# Patient Record
Sex: Female | Born: 1980
Health system: Southern US, Community
[De-identification: ages and names within clinical notes are randomized; demographics above are authoritative.]

## PROBLEM LIST (undated history)

## (undated) ENCOUNTER — Ambulatory Visit (HOSPITAL_COMMUNITY): Disposition: A | Discharge status: Home / Self Care | Payer: BC Managed Care – PPO

## (undated) DIAGNOSIS — E669 Obesity, unspecified: Secondary | ICD-10-CM

## (undated) DIAGNOSIS — R7303 Prediabetes: Secondary | ICD-10-CM

## (undated) DIAGNOSIS — R200 Anesthesia of skin: Secondary | ICD-10-CM

## (undated) DIAGNOSIS — Z973 Presence of spectacles and contact lenses: Secondary | ICD-10-CM

## (undated) DIAGNOSIS — G43909 Migraine, unspecified, not intractable, without status migrainosus: Secondary | ICD-10-CM

## (undated) DIAGNOSIS — K219 Gastro-esophageal reflux disease without esophagitis: Secondary | ICD-10-CM

## (undated) DIAGNOSIS — I959 Hypotension, unspecified: Secondary | ICD-10-CM

## (undated) DIAGNOSIS — D649 Anemia, unspecified: Secondary | ICD-10-CM

## (undated) DIAGNOSIS — J45909 Unspecified asthma, uncomplicated: Secondary | ICD-10-CM

## (undated) HISTORY — PX: WISDOM TOOTH EXTRACTION: SHX21

---
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
## 2000-03-07 ENCOUNTER — Emergency Department (HOSPITAL_COMMUNITY): Admission: EM | Admit: 2000-03-07 | Discharge: 2000-03-07 | Payer: Self-pay | Admitting: Emergency Medicine

## 2002-01-29 ENCOUNTER — Emergency Department (HOSPITAL_COMMUNITY): Admission: EM | Admit: 2002-01-29 | Discharge: 2002-01-29 | Payer: Self-pay | Admitting: Emergency Medicine

## 2004-09-06 ENCOUNTER — Emergency Department (HOSPITAL_COMMUNITY): Admission: EM | Admit: 2004-09-06 | Discharge: 2004-09-06 | Payer: Self-pay | Admitting: Emergency Medicine

## 2005-02-02 ENCOUNTER — Emergency Department (HOSPITAL_COMMUNITY): Admission: EM | Admit: 2005-02-02 | Discharge: 2005-02-02 | Payer: Self-pay | Admitting: Emergency Medicine

## 2005-12-22 ENCOUNTER — Other Ambulatory Visit: Admission: RE | Admit: 2005-12-22 | Discharge: 2005-12-22 | Payer: Self-pay | Admitting: Family Medicine

## 2011-01-20 ENCOUNTER — Emergency Department (HOSPITAL_COMMUNITY): Payer: No Typology Code available for payment source

## 2011-01-20 ENCOUNTER — Emergency Department (HOSPITAL_COMMUNITY)
Admission: EM | Admit: 2011-01-20 | Discharge: 2011-01-21 | Disposition: A | Discharge status: Home / Self Care | Payer: No Typology Code available for payment source | Attending: Emergency Medicine | Admitting: Emergency Medicine

## 2011-01-20 DIAGNOSIS — Z79899 Other long term (current) drug therapy: Secondary | ICD-10-CM | POA: Insufficient documentation

## 2011-01-20 DIAGNOSIS — E876 Hypokalemia: Secondary | ICD-10-CM | POA: Insufficient documentation

## 2011-01-20 DIAGNOSIS — J45909 Unspecified asthma, uncomplicated: Secondary | ICD-10-CM | POA: Insufficient documentation

## 2011-01-20 DIAGNOSIS — R42 Dizziness and giddiness: Secondary | ICD-10-CM | POA: Insufficient documentation

## 2011-01-20 DIAGNOSIS — M542 Cervicalgia: Secondary | ICD-10-CM | POA: Insufficient documentation

## 2011-01-20 DIAGNOSIS — M546 Pain in thoracic spine: Secondary | ICD-10-CM | POA: Insufficient documentation

## 2011-01-20 DIAGNOSIS — R109 Unspecified abdominal pain: Secondary | ICD-10-CM | POA: Insufficient documentation

## 2011-01-20 DIAGNOSIS — T07XXXA Unspecified multiple injuries, initial encounter: Secondary | ICD-10-CM | POA: Insufficient documentation

## 2011-01-20 DIAGNOSIS — S335XXA Sprain of ligaments of lumbar spine, initial encounter: Secondary | ICD-10-CM | POA: Insufficient documentation

## 2011-01-20 DIAGNOSIS — K219 Gastro-esophageal reflux disease without esophagitis: Secondary | ICD-10-CM | POA: Insufficient documentation

## 2011-01-20 DIAGNOSIS — S139XXA Sprain of joints and ligaments of unspecified parts of neck, initial encounter: Secondary | ICD-10-CM | POA: Insufficient documentation

## 2011-01-20 DIAGNOSIS — M79609 Pain in unspecified limb: Secondary | ICD-10-CM | POA: Insufficient documentation

## 2011-01-20 DIAGNOSIS — M25559 Pain in unspecified hip: Secondary | ICD-10-CM | POA: Insufficient documentation

## 2011-01-20 DIAGNOSIS — M545 Low back pain, unspecified: Secondary | ICD-10-CM | POA: Insufficient documentation

## 2011-01-20 LAB — POCT I-STAT, CHEM 8
BUN: 9 mg/dL (ref 6–23)
Calcium, Ion: 1.11 mmol/L — ABNORMAL LOW (ref 1.12–1.32)
Chloride: 101 mEq/L (ref 96–112)
Creatinine, Ser: 1 mg/dL (ref 0.4–1.2)
Glucose, Bld: 106 mg/dL — ABNORMAL HIGH (ref 70–99)
HCT: 37 % (ref 36.0–46.0)
Hemoglobin: 12.6 g/dL (ref 12.0–15.0)
Potassium: 3.3 mEq/L — ABNORMAL LOW (ref 3.5–5.1)
Sodium: 138 mEq/L (ref 135–145)
TCO2: 26 mmol/L (ref 0–100)

## 2011-01-20 LAB — URINALYSIS, ROUTINE W REFLEX MICROSCOPIC
Nitrite: NEGATIVE
Protein, ur: NEGATIVE mg/dL
Specific Gravity, Urine: 1.029 (ref 1.005–1.030)
Urobilinogen, UA: 1 mg/dL (ref 0.0–1.0)

## 2011-01-20 LAB — SAMPLE TO BLOOD BANK

## 2011-01-20 LAB — POCT PREGNANCY, URINE: Preg Test, Ur: NEGATIVE

## 2011-01-21 MED ORDER — IOHEXOL 300 MG/ML  SOLN
100.0000 mL | Freq: Once | INTRAMUSCULAR | Status: AC | PRN
  Administered 2011-01-21: 100 mL via INTRAVENOUS

## 2011-03-29 ENCOUNTER — Inpatient Hospital Stay (INDEPENDENT_AMBULATORY_CARE_PROVIDER_SITE_OTHER)
Admission: RE | Admit: 2011-03-29 | Discharge: 2011-03-29 | Disposition: A | Discharge status: Home / Self Care | Payer: BC Managed Care – PPO | Source: Ambulatory Visit | Attending: Emergency Medicine | Admitting: Emergency Medicine

## 2011-03-29 DIAGNOSIS — B009 Herpesviral infection, unspecified: Secondary | ICD-10-CM
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
## 2016-08-25 ENCOUNTER — Emergency Department (HOSPITAL_COMMUNITY)
Admission: EM | Admit: 2016-08-25 | Discharge: 2016-08-25 | Disposition: A | Discharge status: Home / Self Care | Payer: BC Managed Care – PPO | Attending: Emergency Medicine | Admitting: Emergency Medicine

## 2016-08-25 ENCOUNTER — Encounter (HOSPITAL_COMMUNITY): Payer: Self-pay | Admitting: Emergency Medicine

## 2016-08-25 DIAGNOSIS — R1030 Lower abdominal pain, unspecified: Secondary | ICD-10-CM | POA: Diagnosis present

## 2016-08-25 DIAGNOSIS — R109 Unspecified abdominal pain: Secondary | ICD-10-CM

## 2016-08-25 LAB — URINALYSIS, ROUTINE W REFLEX MICROSCOPIC
Bilirubin Urine: NEGATIVE
Glucose, UA: NEGATIVE mg/dL
Ketones, ur: NEGATIVE mg/dL
Leukocytes, UA: NEGATIVE
Nitrite: NEGATIVE
PROTEIN: 100 mg/dL — AB
Specific Gravity, Urine: 1.031 — ABNORMAL HIGH (ref 1.005–1.030)
pH: 5 (ref 5.0–8.0)

## 2016-08-25 LAB — POC URINE PREG, ED: PREG TEST UR: NEGATIVE

## 2016-08-25 LAB — PREGNANCY, URINE: PREG TEST UR: NEGATIVE

## 2016-08-25 MED ORDER — CEPHALEXIN 500 MG PO CAPS
500.0000 mg | ORAL_CAPSULE | Freq: Once | ORAL | Status: AC
  Administered 2016-08-25: 500 mg via ORAL
  Filled 2016-08-25: qty 1

## 2016-08-25 MED ORDER — CEPHALEXIN 500 MG PO CAPS: 500.0000 mg | ORAL_CAPSULE | Freq: Three times a day (TID) | ORAL | 0 refills | Status: DC

## 2016-08-25 MED ORDER — OXYCODONE-ACETAMINOPHEN 5-325 MG PO TABS
1.0000 | ORAL_TABLET | Freq: Once | ORAL | Status: AC
  Administered 2016-08-25: 1 via ORAL
  Filled 2016-08-25: qty 1

## 2016-08-25 MED ORDER — IBUPROFEN 200 MG PO TABS
600.0000 mg | ORAL_TABLET | Freq: Once | ORAL | Status: AC
  Administered 2016-08-25: 600 mg via ORAL
  Filled 2016-08-25: qty 3

## 2016-08-25 MED ORDER — IBUPROFEN 600 MG PO TABS: 600.0000 mg | ORAL_TABLET | Freq: Three times a day (TID) | ORAL | 0 refills | Status: DC | PRN

## 2016-08-25 MED FILL — CEPHALEXIN 500 MG CAPSULE: 500 | 5 days supply | Qty: 15 | Fill #0

## 2016-08-25 MED FILL — IBUPROFEN 600 MG TABLET: 600 | 5 days supply | Qty: 15 | Fill #0

## 2016-08-25 NOTE — ED Provider Notes (Signed)
[facility]-EMERGENCY DEPT Provider Note   CSN: [id number] Arrival date & time: 08/25/16  0756     History   Chief Complaint Chief Complaint  Patient presents with  . menstrual cramps    HPI Virginia Sloan is a 35 y.o. female.  HPI A she presents emergency department with complaints of crampy lower abdominal discomfort which began yesterday and feels that his cramping increased today.  She is started her menstrual cycle.  She states that she had cramping with her menstrual cycle last month but was not as severe.  She has her usual amount of vaginal bleeding associated with this.  Denies back pain flank pain.  No fevers or chills.  No nausea or vomiting.  No dysuria or urinary frequency.  Symptoms are moderate in severity   History reviewed. No pertinent past medical history.  There are no active problems to display for this patient.   History reviewed. No pertinent surgical history.  OB History    No data available       Home Medications    Prior to Admission medications   Medication Sig Start Date End Date Taking? Authorizing Provider  albuterol (PROVENTIL HFA;VENTOLIN HFA) 108 (90 Base) MCG/ACT inhaler Inhale 2 puffs into the lungs every 6 (six) hours as needed for shortness of breath. 06/20/15  Yes Historical Provider, MD  esomeprazole (NEXIUM) 20 MG capsule Take 20 mg by mouth daily.   Yes Historical Provider, MD  Prenatal Vit-Fe Fumarate-FA (PRENATAL MULTIVITAMIN) TABS tablet Take 1 tablet by mouth daily at 12 noon.   Yes Historical Provider, MD  tetrahydrozoline (VISINE) 0.05 % ophthalmic solution Place 1 drop into both eyes daily as needed (dry eyes/contact).   Yes Historical Provider, MD  cephALEXin (KEFLEX) 500 MG capsule Take 1 capsule (500 mg total) by mouth 3 (three) times daily. 08/25/16   Azalia BilisKevin Destinee Taber, MD  ibuprofen (ADVIL,MOTRIN) 600 MG tablet Take 1 tablet (600 mg total) by mouth every 8 (eight) hours as needed. 08/25/16   Azalia BilisKevin Jacky Dross, MD    Family  History No family history on file.  Social History Social History  Substance Use Topics  . Smoking status: Never Smoker  . Smokeless tobacco: Not on file  . Alcohol use No     Allergies   Tomato and Other   Review of Systems Review of Systems  All other systems reviewed and are negative.    Physical Exam Updated Vital Signs BP 113/80 (BP Location: Left Arm)   Pulse 79   Temp 98.1 F (36.7 C) (Oral)   Resp 16   Ht 5' 4.25" (1.632 m)   Wt 208 lb (94.3 kg)   LMP 08/24/2016   SpO2 95%   BMI 35.43 kg/m   Physical Exam  Constitutional: She is oriented to person, place, and time. She appears well-developed and well-nourished. No distress.  HENT:  Head: Normocephalic and atraumatic.  Eyes: EOM are normal.  Neck: Normal range of motion.  Cardiovascular: Normal rate, regular rhythm and normal heart sounds.   Pulmonary/Chest: Effort normal and breath sounds normal.  Abdominal: Soft. She exhibits no distension. There is no tenderness.  Musculoskeletal: Normal range of motion.  Neurological: She is alert and oriented to person, place, and time.  Skin: Skin is warm and dry.  Psychiatric: She has a normal mood and affect. Judgment normal.  Nursing note and vitals reviewed.    ED Treatments / Results  Labs (all labs ordered are listed, but only abnormal results are displayed) Labs Reviewed  URINALYSIS, ROUTINE W REFLEX MICROSCOPIC - Abnormal; Notable for the following:       Result Value   APPearance HAZY (*)    Specific Gravity, Urine 1.031 (*)    Hgb urine dipstick LARGE (*)    Protein, ur 100 (*)    Bacteria, UA RARE (*)    Squamous Epithelial / LPF 0-5 (*)    All other components within normal limits  URINE CULTURE  PREGNANCY, URINE  POC URINE PREG, ED    EKG  EKG Interpretation None       Radiology No results found.  Procedures Procedures (including critical care time)  Medications Ordered in ED Medications  oxyCODONE-acetaminophen  (PERCOCET/ROXICET) 5-325 MG per tablet 1 tablet (1 tablet Oral Given 08/25/16 1104)  ibuprofen (ADVIL,MOTRIN) tablet 600 mg (600 mg Oral Given 08/25/16 1104)  cephALEXin (KEFLEX) capsule 500 mg (500 mg Oral Given 08/25/16 1104)     Initial Impression / Assessment and Plan / ED Course  I have reviewed the triage vital signs and the nursing notes.  Pertinent labs & imaging results that were available during my care of the patient were reviewed by me and considered in my medical decision making (see chart for details).  Clinical Course     No significant abdominal tenderness.  No flank pain.  Doubt ureteral stone.  Urine pregnancy test is negative.  May have a urinary tract infection.  Sent for urine culture.  Home on Keflex.  Feels better at this time.  I do not believe she needs advanced imaging at this time.  I have asked that she return to the ER for new or worsening symptoms  Final Clinical Impressions(s) / ED Diagnoses   Final diagnoses:  Abdominal pain, unspecified abdominal location    New Prescriptions New Prescriptions   CEPHALEXIN (KEFLEX) 500 MG CAPSULE    Take 1 capsule (500 mg total) by mouth 3 (three) times daily.   IBUPROFEN (ADVIL,MOTRIN) 600 MG TABLET    Take 1 tablet (600 mg total) by mouth every 8 (eight) hours as needed.     Azalia BilisKevin Falon Flinchum, MD 08/25/16 [phone]

## 2016-08-25 NOTE — ED Triage Notes (Signed)
Per pt, states menstrual cramps-states pain has made her dizzy-states started yesterday and increased symptoms today

## 2016-08-25 NOTE — ED Notes (Signed)
Informed patient with regards to needing an urine sample

## 2016-08-27 LAB — URINE CULTURE

## 2018-10-10 ENCOUNTER — Other Ambulatory Visit: Payer: Self-pay | Admitting: Obstetrics and Gynecology

## 2018-10-16 ENCOUNTER — Ambulatory Visit (HOSPITAL_COMMUNITY)
Admission: RE | Admit: 2018-10-16 | Discharge: 2018-10-16 | Disposition: A | Discharge status: Home / Self Care | Payer: BC Managed Care – PPO | Source: Ambulatory Visit | Attending: Obstetrics and Gynecology | Admitting: Obstetrics and Gynecology

## 2018-10-16 DIAGNOSIS — D509 Iron deficiency anemia, unspecified: Secondary | ICD-10-CM | POA: Insufficient documentation

## 2018-10-16 MED ORDER — SODIUM CHLORIDE 0.9 % IV SOLN
510.0000 mg | Freq: Once | INTRAVENOUS | Status: AC
  Administered 2018-10-16: 510 mg via INTRAVENOUS
  Filled 2018-10-16: qty 17

## 2018-10-16 MED ORDER — SODIUM CHLORIDE 0.9 % IV SOLN
INTRAVENOUS | Status: DC | PRN
  Administered 2018-10-16: 250 mL via INTRAVENOUS

## 2018-10-16 NOTE — Discharge Instructions (Signed)

## 2018-10-16 NOTE — Progress Notes (Signed)
PATIENT CARE CENTER NOTE  Diagnosis: Iron Deficiency Anemia    Provider: Dr. Maxie Better     Procedure: IV Feraheme    Note: Patient received Feraheme infusion. Observed patient for 30 minutes post-infusion. Tolerated infusion well. Vital signs stable. Discharge instructions given. Patient to come back next Monday for second dose. Alert, oriented and ambulatory at discharge.

## 2018-10-23 ENCOUNTER — Ambulatory Visit (HOSPITAL_COMMUNITY)
Admission: RE | Admit: 2018-10-23 | Discharge: 2018-10-23 | Disposition: A | Discharge status: Home / Self Care | Payer: BC Managed Care – PPO | Source: Ambulatory Visit | Attending: Obstetrics and Gynecology | Admitting: Obstetrics and Gynecology

## 2018-10-23 DIAGNOSIS — D509 Iron deficiency anemia, unspecified: Secondary | ICD-10-CM | POA: Diagnosis not present

## 2018-10-23 MED ORDER — SODIUM CHLORIDE 0.9 % IV SOLN
510.0000 mg | Freq: Once | INTRAVENOUS | Status: AC
  Administered 2018-10-23: 510 mg via INTRAVENOUS
  Filled 2018-10-23: qty 17

## 2018-10-23 MED ORDER — SODIUM CHLORIDE 0.9 % IV SOLN
INTRAVENOUS | Status: DC | PRN
  Administered 2018-10-23: 250 mL via INTRAVENOUS

## 2018-10-23 NOTE — Discharge Instructions (Signed)

## 2018-10-23 NOTE — Progress Notes (Signed)
PATIENT CARE CENTER NOTE  Diagnosis: Iron Deficiency Anemia    Provider: Dr. Maxie Better     Procedure: IV Feraheme    Note: Patient received second Feraheme infusion. Observed patient for 30 minutes post-infusion. Tolerated infusion well. Vital signs stable. Discharge instructions given. Alert, oriented and ambulatory at discharge.

## 2018-10-26 ENCOUNTER — Other Ambulatory Visit: Payer: Self-pay | Admitting: Obstetrics and Gynecology

## 2018-11-07 ENCOUNTER — Other Ambulatory Visit: Payer: Self-pay

## 2018-11-07 ENCOUNTER — Other Ambulatory Visit: Payer: Self-pay | Admitting: Obstetrics and Gynecology

## 2018-11-07 ENCOUNTER — Encounter (HOSPITAL_BASED_OUTPATIENT_CLINIC_OR_DEPARTMENT_OTHER): Payer: Self-pay

## 2018-11-07 NOTE — Progress Notes (Signed)
Spoke with: "Virginia Sloan" NPO: No food after midnight/Clear liquids until 7:30AM DOS Arrival time: 11:30AM Labs: CBC, UPT AM medications: Nexium, Bring Asthma Inhaler Pre op orders: Yes Ride home:  Dina Rich (husband) [phone]

## 2018-11-08 ENCOUNTER — Encounter (HOSPITAL_BASED_OUTPATIENT_CLINIC_OR_DEPARTMENT_OTHER): Payer: Self-pay | Admitting: Anesthesiology

## 2018-11-08 ENCOUNTER — Ambulatory Visit (HOSPITAL_BASED_OUTPATIENT_CLINIC_OR_DEPARTMENT_OTHER): Payer: BC Managed Care – PPO | Admitting: Anesthesiology

## 2018-11-08 ENCOUNTER — Other Ambulatory Visit: Payer: Self-pay

## 2018-11-08 ENCOUNTER — Ambulatory Visit (HOSPITAL_BASED_OUTPATIENT_CLINIC_OR_DEPARTMENT_OTHER)
Admission: RE | Admit: 2018-11-08 | Discharge: 2018-11-08 | Disposition: A | Discharge status: Home / Self Care | Payer: BC Managed Care – PPO | Attending: Obstetrics and Gynecology | Admitting: Obstetrics and Gynecology

## 2018-11-08 ENCOUNTER — Encounter (HOSPITAL_BASED_OUTPATIENT_CLINIC_OR_DEPARTMENT_OTHER)
Admission: RE | Disposition: A | Discharge status: Home / Self Care | Payer: Self-pay | Source: Home / Self Care | Attending: Obstetrics and Gynecology

## 2018-11-08 DIAGNOSIS — Z6841 Body Mass Index (BMI) 40.0 and over, adult: Secondary | ICD-10-CM | POA: Diagnosis not present

## 2018-11-08 DIAGNOSIS — E669 Obesity, unspecified: Secondary | ICD-10-CM | POA: Insufficient documentation

## 2018-11-08 DIAGNOSIS — K219 Gastro-esophageal reflux disease without esophagitis: Secondary | ICD-10-CM | POA: Diagnosis not present

## 2018-11-08 DIAGNOSIS — Z79899 Other long term (current) drug therapy: Secondary | ICD-10-CM | POA: Diagnosis not present

## 2018-11-08 DIAGNOSIS — R7303 Prediabetes: Secondary | ICD-10-CM | POA: Diagnosis not present

## 2018-11-08 DIAGNOSIS — N840 Polyp of corpus uteri: Secondary | ICD-10-CM | POA: Diagnosis not present

## 2018-11-08 DIAGNOSIS — J45909 Unspecified asthma, uncomplicated: Secondary | ICD-10-CM | POA: Diagnosis not present

## 2018-11-08 DIAGNOSIS — N920 Excessive and frequent menstruation with regular cycle: Secondary | ICD-10-CM | POA: Diagnosis present

## 2018-11-08 DIAGNOSIS — Z91018 Allergy to other foods: Secondary | ICD-10-CM | POA: Insufficient documentation

## 2018-11-08 DIAGNOSIS — Z91040 Latex allergy status: Secondary | ICD-10-CM | POA: Insufficient documentation

## 2018-11-08 HISTORY — DX: Anesthesia of skin: R20.0

## 2018-11-08 HISTORY — DX: Anemia, unspecified: D64.9

## 2018-11-08 HISTORY — DX: Presence of spectacles and contact lenses: Z97.3

## 2018-11-08 HISTORY — PX: DILATATION & CURETTAGE/HYSTEROSCOPY WITH MYOSURE: SHX6511

## 2018-11-08 HISTORY — DX: Obesity, unspecified: E66.9

## 2018-11-08 HISTORY — DX: Gastro-esophageal reflux disease without esophagitis: K21.9

## 2018-11-08 HISTORY — DX: Prediabetes: R73.03

## 2018-11-08 HISTORY — DX: Migraine, unspecified, not intractable, without status migrainosus: G43.909

## 2018-11-08 HISTORY — DX: Unspecified asthma, uncomplicated: J45.909

## 2018-11-08 HISTORY — DX: Hypotension, unspecified: I95.9

## 2018-11-08 LAB — CBC
HEMATOCRIT: 36.5 % (ref 36.0–46.0)
Hemoglobin: 11.3 g/dL — ABNORMAL LOW (ref 12.0–15.0)
MCH: 25.9 pg — ABNORMAL LOW (ref 26.0–34.0)
MCHC: 31 g/dL (ref 30.0–36.0)
MCV: 83.5 fL (ref 80.0–100.0)
Platelets: 135 10*3/uL — ABNORMAL LOW (ref 150–400)
RBC: 4.37 MIL/uL (ref 3.87–5.11)
RDW: 21.6 % — AB (ref 11.5–15.5)
WBC: 9.8 10*3/uL (ref 4.0–10.5)
nRBC: 0 % (ref 0.0–0.2)

## 2018-11-08 LAB — POCT PREGNANCY, URINE: Preg Test, Ur: NEGATIVE

## 2018-11-08 SURGERY — DILATATION & CURETTAGE/HYSTEROSCOPY WITH MYOSURE
Anesthesia: General | Site: Vagina

## 2018-11-08 MED ORDER — SODIUM CHLORIDE 0.9 % IR SOLN
Status: DC | PRN
  Administered 2018-11-08: 3000 mL

## 2018-11-08 MED ORDER — OXYCODONE HCL 5 MG PO TABS
ORAL_TABLET | ORAL | Status: AC
  Filled 2018-11-08: qty 1

## 2018-11-08 MED ORDER — MEPERIDINE HCL 25 MG/ML IJ SOLN
6.2500 mg | INTRAMUSCULAR | Status: DC | PRN
  Filled 2018-11-08: qty 1

## 2018-11-08 MED ORDER — ACETAMINOPHEN 160 MG/5ML PO SOLN
325.0000 mg | Freq: Once | ORAL | Status: DC
  Filled 2018-11-08: qty 20.3

## 2018-11-08 MED ORDER — ACETAMINOPHEN 325 MG PO TABS
325.0000 mg | ORAL_TABLET | Freq: Once | ORAL | Status: DC
  Filled 2018-11-08: qty 2

## 2018-11-08 MED ORDER — LACTATED RINGERS IV SOLN
INTRAVENOUS | Status: DC
  Administered 2018-11-08: 13:00:00 via INTRAVENOUS
  Filled 2018-11-08: qty 1000

## 2018-11-08 MED ORDER — KETOROLAC TROMETHAMINE 60 MG/2ML IM SOLN
INTRAMUSCULAR | Status: DC | PRN
  Administered 2018-11-08: 30 mg via INTRAMUSCULAR

## 2018-11-08 MED ORDER — FENTANYL CITRATE (PF) 100 MCG/2ML IJ SOLN
INTRAMUSCULAR | Status: DC | PRN
  Administered 2018-11-08: 100 ug via INTRAVENOUS

## 2018-11-08 MED ORDER — OXYCODONE HCL 5 MG/5ML PO SOLN
5.0000 mg | Freq: Once | ORAL | Status: AC | PRN
  Filled 2018-11-08: qty 5

## 2018-11-08 MED ORDER — ONDANSETRON HCL 4 MG/2ML IJ SOLN
INTRAMUSCULAR | Status: AC
  Filled 2018-11-08: qty 2

## 2018-11-08 MED ORDER — KETOROLAC TROMETHAMINE 30 MG/ML IJ SOLN
INTRAMUSCULAR | Status: AC
  Filled 2018-11-08: qty 2

## 2018-11-08 MED ORDER — MIDAZOLAM HCL 2 MG/2ML IJ SOLN
INTRAMUSCULAR | Status: AC
  Filled 2018-11-08: qty 2

## 2018-11-08 MED ORDER — KETOROLAC TROMETHAMINE 30 MG/ML IJ SOLN
INTRAMUSCULAR | Status: DC | PRN
  Administered 2018-11-08: 30 mg via INTRAVENOUS

## 2018-11-08 MED ORDER — IBUPROFEN 800 MG PO TABS: 800.0000 mg | ORAL_TABLET | Freq: Three times a day (TID) | ORAL | 0 refills | Status: AC | PRN

## 2018-11-08 MED ORDER — PROPOFOL 10 MG/ML IV BOLUS
INTRAVENOUS | Status: DC | PRN
  Administered 2018-11-08: 200 mg via INTRAVENOUS

## 2018-11-08 MED ORDER — DEXAMETHASONE SODIUM PHOSPHATE 10 MG/ML IJ SOLN
INTRAMUSCULAR | Status: AC
  Filled 2018-11-08: qty 1

## 2018-11-08 MED ORDER — ONDANSETRON HCL 4 MG/2ML IJ SOLN
INTRAMUSCULAR | Status: DC | PRN
  Administered 2018-11-08: 4 mg via INTRAVENOUS

## 2018-11-08 MED ORDER — LIDOCAINE 2% (20 MG/ML) 5 ML SYRINGE
INTRAMUSCULAR | Status: AC
  Filled 2018-11-08: qty 5

## 2018-11-08 MED ORDER — LACTATED RINGERS IV SOLN
INTRAVENOUS | Status: DC
  Administered 2018-11-08: 16:00:00 via INTRAVENOUS
  Filled 2018-11-08: qty 1000

## 2018-11-08 MED ORDER — FENTANYL CITRATE (PF) 100 MCG/2ML IJ SOLN
INTRAMUSCULAR | Status: AC
  Filled 2018-11-08: qty 2

## 2018-11-08 MED ORDER — LIDOCAINE 2% (20 MG/ML) 5 ML SYRINGE
INTRAMUSCULAR | Status: DC | PRN
  Administered 2018-11-08: 80 mg via INTRAVENOUS

## 2018-11-08 MED ORDER — DEXAMETHASONE SODIUM PHOSPHATE 4 MG/ML IJ SOLN
INTRAMUSCULAR | Status: DC | PRN
  Administered 2018-11-08: 10 mg via INTRAVENOUS

## 2018-11-08 MED ORDER — MIDAZOLAM HCL 5 MG/5ML IJ SOLN
INTRAMUSCULAR | Status: DC | PRN
  Administered 2018-11-08: 2 mg via INTRAVENOUS

## 2018-11-08 MED ORDER — ACETAMINOPHEN 10 MG/ML IV SOLN
1000.0000 mg | Freq: Once | INTRAVENOUS | Status: DC | PRN
  Filled 2018-11-08: qty 100

## 2018-11-08 MED ORDER — OXYCODONE HCL 5 MG PO TABS
5.0000 mg | ORAL_TABLET | Freq: Once | ORAL | Status: AC | PRN
  Administered 2018-11-08: 5 mg via ORAL
  Filled 2018-11-08: qty 1

## 2018-11-08 MED ORDER — FENTANYL CITRATE (PF) 100 MCG/2ML IJ SOLN
25.0000 ug | INTRAMUSCULAR | Status: DC | PRN
  Administered 2018-11-08: 25 ug via INTRAVENOUS
  Filled 2018-11-08: qty 1

## 2018-11-08 MED ORDER — PROPOFOL 10 MG/ML IV BOLUS
INTRAVENOUS | Status: AC
  Filled 2018-11-08: qty 20

## 2018-11-08 MED ORDER — PROMETHAZINE HCL 25 MG/ML IJ SOLN
6.2500 mg | INTRAMUSCULAR | Status: DC | PRN
  Filled 2018-11-08: qty 1

## 2018-11-08 SURGICAL SUPPLY — 28 items
BIPOLAR CUTTING LOOP 21FR (ELECTRODE)
CANISTER SUCT 3000ML PPV (MISCELLANEOUS) ×3 IMPLANT
CATH ROBINSON RED A/P 16FR (CATHETERS) IMPLANT
CATH SILICONE 16FRX5CC (CATHETERS) ×2 IMPLANT
COVER BACK TABLE 60X90IN (DRAPES) ×3 IMPLANT
COVER WAND RF STERILE (DRAPES) ×4 IMPLANT
DEVICE MYOSURE LITE (MISCELLANEOUS) IMPLANT
DEVICE MYOSURE REACH (MISCELLANEOUS) ×2 IMPLANT
DILATOR CANAL MILEX (MISCELLANEOUS) IMPLANT
DRAPE SHEET LG 3/4 BI-LAMINATE (DRAPES) ×3 IMPLANT
GAUZE 4X4 16PLY RFD (DISPOSABLE) ×3 IMPLANT
GLOVE BIO SURGEON STRL SZ7 (GLOVE) IMPLANT
GLOVE BIOGEL PI IND STRL 7.0 (GLOVE) ×1 IMPLANT
GLOVE BIOGEL PI INDICATOR 7.0 (GLOVE) ×4
GLOVE ECLIPSE 6.5 STRL STRAW (GLOVE) ×3 IMPLANT
GOWN STRL REUS W/TWL LRG LVL3 (GOWN DISPOSABLE) ×5 IMPLANT
IV NS IRRIG 3000ML ARTHROMATIC (IV SOLUTION) ×3 IMPLANT
KIT PROCEDURE FLUENT (KITS) ×3 IMPLANT
KIT TURNOVER CYSTO (KITS) ×3 IMPLANT
LOOP CUTTING BIPOLAR 21FR (ELECTRODE) IMPLANT
MYOSURE XL FIBROID (MISCELLANEOUS)
PACK VAGINAL MINOR WOMEN LF (CUSTOM PROCEDURE TRAY) ×3 IMPLANT
PAD OB MATERNITY 4.3X12.25 (PERSONAL CARE ITEMS) ×3 IMPLANT
PAD PREP 24X48 CUFFED NSTRL (MISCELLANEOUS) ×3 IMPLANT
SEAL ROD LENS SCOPE MYOSURE (ABLATOR) ×3 IMPLANT
SYSTEM TISS REMOVAL MYOSURE XL (MISCELLANEOUS) IMPLANT
TOWEL OR 17X26 10 PK STRL BLUE (TOWEL DISPOSABLE) ×4 IMPLANT
WATER STERILE IRR 500ML POUR (IV SOLUTION) ×3 IMPLANT

## 2018-11-08 NOTE — Discharge Instructions (Signed)
CALL  IF TEMP>100.4, NOTHING PER VAGINA X 2 WK, CALL IF SOAKING A MAXI  PAD EVERY HOUR OR MORE FREQUENTLY   NO ADVIL, ALEVE, MOTRIN, IBUPROFEN UNTIL 8 PM TONIGHT   D & C Home care Instructions:   Personal hygiene:  Used sanitary napkins for vaginal drainage not tampons. Shower or tub bathe the day after your procedure. No douching until bleeding stops. Always wipe from front to back after  Elimination.  Activity: Do not drive or operate any equipment today. The effects of the anesthesia are still present and drowsiness may result. Rest today, not necessarily flat bed rest, just take it easy. You may resume your normal activity in one to 2 days.  Sexual activity: No intercourse for one week or as indicated by your physician  Diet: Eat a light diet as desired this evening. You may resume a regular diet tomorrow.  Return to work: One to 2 days.  General Expectations of your surgery: Vaginal bleeding should be no heavier than a normal period. Spotting may continue up to 10 days. Mild cramps may continue for a couple of days. You may have a regular period in 2-6 weeks.  Unexpected observations call your doctor if these occur: persistent or heavy bleeding. Severe abdominal cramping or pain. Elevation of temperature greater than 100F.  Call for an appointment in one week.    Post Anesthesia Home Care Instructions  Activity: Get plenty of rest for the remainder of the day. A responsible adult should stay with you for 24 hours following the procedure.  For the next 24 hours, DO NOT: -Drive a car -Advertising copywriter -Drink alcoholic beverages -Take any medication unless instructed by your physician -Make any legal decisions or sign important papers.  Meals: Start with liquid foods such as gelatin or soup. Progress to regular foods as tolerated. Avoid greasy, spicy, heavy foods. If nausea and/or vomiting occur, drink only clear liquids until the nausea and/or vomiting subsides. Call your  physician if vomiting continues.  Special Instructions/Symptoms: Your throat may feel dry or sore from the anesthesia or the breathing tube placed in your throat during surgery. If this causes discomfort, gargle with warm salt water. The discomfort should disappear within 24 hours.  If you had a scopolamine patch placed behind your ear for the management of post- operative nausea and/or vomiting:  1. The medication in the patch is effective for 72 hours, after which it should be removed.  Wrap patch in a tissue and discard in the trash. Wash hands thoroughly with soap and water. 2. You may remove the patch earlier than 72 hours if you experience unpleasant side effects which may include dry mouth, dizziness or visual disturbances. 3. Avoid touching the patch. Wash your hands with soap and water after contact with the patch.

## 2018-11-08 NOTE — Brief Op Note (Signed)
11/08/2018  2:23 PM  PATIENT:  Virginia Sloan  38 y.o. female  PRE-OPERATIVE DIAGNOSIS:  Endometrial Polyp, Menorrhagia with regular cycle  POST-OPERATIVE DIAGNOSIS:  Endometrial Polyp, Menorrhagia with regular cycle  PROCEDURE:  Diagnostic hysteroscopy, D&C, hysteroscopic resection of endometrial polyp  SURGEON:  Surgeon(s) and Role:    * Maxie Better, MD - Primary  PHYSICIAN ASSISTANT:   ASSISTANTS: none   ANESTHESIA:   general FINDING: thickened endometrium, possible endometrial polyp EBL:  Minimal   BLOOD ADMINISTERED:none  DRAINS: none   LOCAL MEDICATIONS USED:  NONE  SPECIMEN:  Source of Specimen:  EMC with ? polyp  DISPOSITION OF SPECIMEN:  PATHOLOGY  COUNTS:  YES  TOURNIQUET:  * No tourniquets in log *  DICTATION: .Other Dictation: Dictation Number [id number]  PLAN OF CARE: Discharge to home after PACU  PATIENT DISPOSITION:  PACU - hemodynamically stable.   Delay start of Pharmacological VTE agent (>24hrs) due to surgical blood loss or risk of bleeding: no

## 2018-11-08 NOTE — H&P (Addendum)
Virginia Sloan is an 38 y.o. female.G0 BF here for dx hysteroscopy, D&C resection of endometrial polyps noted on sonohysterogram. Pt has had menorrhagia with reg cycles  Pertinent Gynecological History: Menses: flow is excessive with use of 7 pads or tampons on heaviest days Bleeding: menorrhagia Contraception: none DES exposure: denies Blood transfusions: none Sexually transmitted diseases: no past history Previous GYN Procedures: n/a  Last mammogram: n/a Date: n/a Last pap: normal Date: 2019 OB History: G0   Menstrual History: Menarche age:   Patient's last menstrual period was 10/10/2018 (exact date).    Past Medical History:  Diagnosis Date  . Anemia    iron infusions x2  . Asthma   . Bilateral hand numbness   . GERD (gastroesophageal reflux disease)   . Low blood pressure   . Migraines   . Obese   . Pre-diabetes   . Wears contact lenses   . Wears glasses     Past Surgical History:  Procedure Laterality Date  . WISDOM TOOTH EXTRACTION      History reviewed. No pertinent family history.  Social History:  reports that she has never smoked. She has never used smokeless tobacco. She reports current alcohol use. She reports that she does not use drugs.  Allergies:  Allergies  Allergen Reactions  . Tomato Hives and Nausea And Vomiting    Raw tomato--  . Latex Rash  . Other Rash    Tree nuts    No medications prior to admission.    Review of Systems  All other systems reviewed and are negative.   Height 5' 4.75" (1.645 m), weight 113.4 kg, last menstrual period 10/10/2018. Physical Exam  Constitutional: She is oriented to person, place, and time. She appears well-developed and well-nourished.  HENT:  Head: Atraumatic.  Eyes: EOM are normal.  Neck: Neck supple.  Cardiovascular: Regular rhythm.  Respiratory: Effort normal.  GI: Soft.  Genitourinary:    Vagina normal.     Genitourinary Comments: Cervix closed And no palp mass Vulva nl Uterus  irreg   Neurological: She is alert and oriented to person, place, and time.  Skin: Skin is warm and dry.  Psychiatric: She has a normal mood and affect.    No results found for this or any previous visit (from the past 24 hour(s)).  No results found.  Assessment/Plan: Menorrhagia with regular cycle Endometrial thickening on sonogram Endometrial polyps P) dx hysteroscopy, D&C , resection of endometrial polyp. Risk of surgery reviewed including infection, bleeding, injury to surrounding organ structures, internal scar tissue, uterine perforation( 09/998) and its risk, thermal injury  Sachit Gilman A Adriona Kaney 11/08/2018,

## 2018-11-08 NOTE — Transfer of Care (Signed)
Immediate Anesthesia Transfer of Care Note  Patient: Virginia Sloan  Procedure(s) Performed: DILATATION & CURETTAGE/HYSTEROSCOPY WITH MYOSURE (N/A Vagina )  Patient Location: PACU  Anesthesia Type:General  Level of Consciousness: awake  Airway & Oxygen Therapy: Patient Spontanous Breathing and Patient connected to nasal cannula oxygen  Post-op Assessment: Report given to RN and Post -op Vital signs reviewed and stable  Post vital signs: Reviewed and stable  Last Vitals:  Vitals Value Taken Time  BP 124/84 11/08/2018  2:27 PM  Temp    Pulse 93 11/08/2018  2:29 PM  Resp 17 11/08/2018  2:29 PM  SpO2 100 % 11/08/2018  2:29 PM  Vitals shown include unvalidated device data.  Last Pain:  Vitals:   11/08/18 1241  TempSrc:   PainSc: 7       Patients Stated Pain Goal: 7 (11/08/18 1241)  Complications: No apparent anesthesia complications

## 2018-11-08 NOTE — Anesthesia Procedure Notes (Signed)
Procedure Name: LMA Insertion Date/Time: 11/08/2018 1:46 PM Performed by: Caren Macadam, CRNA Pre-anesthesia Checklist: Patient identified, Emergency Drugs available, Suction available and Patient being monitored Patient Re-evaluated:Patient Re-evaluated prior to induction Oxygen Delivery Method: Circle system utilized Preoxygenation: Pre-oxygenation with 100% oxygen Induction Type: IV induction Ventilation: Mask ventilation without difficulty LMA: LMA inserted LMA Size: 4.0 Number of attempts: 1 Placement Confirmation: positive ETCO2 and breath sounds checked- equal and bilateral Tube secured with: Tape Dental Injury: Teeth and Oropharynx as per pre-operative assessment

## 2018-11-08 NOTE — Anesthesia Postprocedure Evaluation (Signed)
Anesthesia Post Note  Patient: EMYLEE BOLTER  Procedure(s) Performed: DILATATION & CURETTAGE/HYSTEROSCOPY WITH MYOSURE (N/A Vagina )     Patient location during evaluation: PACU Anesthesia Type: General Level of consciousness: awake and alert Pain management: pain level controlled Vital Signs Assessment: post-procedure vital signs reviewed and stable Respiratory status: spontaneous breathing, nonlabored ventilation, respiratory function stable and patient connected to nasal cannula oxygen Cardiovascular status: blood pressure returned to baseline and stable Postop Assessment: no apparent nausea or vomiting Anesthetic complications: no    Last Vitals:  Vitals:   11/08/18 1430 11/08/18 1445  BP: 125/87   Pulse: 95 98  Resp: 18 16  Temp: 36.5 C   SpO2: 100% 95%    Last Pain:  Vitals:   11/08/18 1430  TempSrc:   PainSc: 0-No pain                 Varnell Orvis

## 2018-11-08 NOTE — Op Note (Signed)
NAME: Virginia Sloan, Virginia Sloan. MEDICAL RECORD VO:[id number] ACCOUNT [id number] DATE OF BIRTH:[birth year] FACILITY: [facility] LOCATION: [facility] PHYSICIAN:Zendaya Groseclose A. Richell Corker, MD  OPERATIVE REPORT  DATE OF PROCEDURE:  11/08/2018  PREOPERATIVE DIAGNOSIS:  Menorrhagia with regular cycles, endometrial mass.  PROCEDURE PERFORMED:  Diagnostic hysteroscopy, hysteroscopic resection of endometrial polyp, dilation and curettage.  POSTOPERATIVE DIAGNOSIS:  Menorrhagia with regular cycles, endometrial mass.  ANESTHESIA:  General.  SURGEON:  Maxie Better, MD  ASSISTANT:  None.  DESCRIPTION OF PROCEDURE:  Under adequate general anesthesia, the patient was placed in the dorsal lithotomy position.  She was sterilely prepped and draped in the usual fashion.  The bladder was catheterized a moderate amount of urine.  Examination under anesthesia revealed an axial to retroflexed uterus.  No adnexal masses could be appreciated.  Bivalve speculum was placed in the vagina.  Single tooth tenaculum was placed on the anterior lip of the cervix.  The cervix was then serially dilated up to a #23 Pratt dilator.  A diagnostic hysteroscope was introduced into the uterine cavity.  Diffuse polypoid endometrial thickening was noted.  Using the Reach resectoscope, the endometrial cavity was resected of all polypoid lesion.  The endocervical canal was without any lesions.  When all thickening and polypoid lesions were removed, all instruments were then removed from the vagina.  SPECIMEN:  Labeled endometrial curettings with possible polyp were sent to pathology.  ESTIMATED BLOOD LOSS:  About 10 mL.  FLUID DEFICIT:  200 mL  COUNTS:  Sponge and instrument counts x2 was correct.  COMPLICATIONS:  None.  The patient tolerated the procedure well and was transferred to recovery room in stable condition.  TN/NUANCE  D:11/08/2018 T:11/08/2018 JOB:[id number]

## 2018-11-08 NOTE — Anesthesia Preprocedure Evaluation (Addendum)
Anesthesia Evaluation  Patient identified by MRN, date of birth, ID band Patient awake    Reviewed: Allergy & Precautions, NPO status , Patient's Chart, lab work & pertinent test results  Airway Mallampati: III  TM Distance: >3 FB Neck ROM: Full    Dental  (+) Teeth Intact, Dental Advisory Given   Pulmonary asthma ,    breath sounds clear to auscultation       Cardiovascular negative cardio ROS   Rhythm:Regular Rate:Normal     Neuro/Psych  Headaches, negative psych ROS   GI/Hepatic Neg liver ROS, GERD  Medicated and Controlled,  Endo/Other  negative endocrine ROS  Renal/GU negative Renal ROS     Musculoskeletal negative musculoskeletal ROS (+)   Abdominal (+) + obese,   Peds  Hematology   Anesthesia Other Findings   Reproductive/Obstetrics                            Anesthesia Physical Anesthesia Plan  ASA: III  Anesthesia Plan: General   Post-op Pain Management:    Induction: Intravenous  PONV Risk Score and Plan: 4 or greater and Ondansetron, Dexamethasone, Midazolam and Scopolamine patch - Pre-op  Airway Management Planned: LMA  Additional Equipment: None  Intra-op Plan:   Post-operative Plan: Extubation in OR  Informed Consent: I have reviewed the patients History and Physical, chart, labs and discussed the procedure including the risks, benefits and alternatives for the proposed anesthesia with the patient or authorized representative who has indicated his/her understanding and acceptance.     Dental advisory given  Plan Discussed with: CRNA  Anesthesia Plan Comments:        Anesthesia Quick Evaluation

## 2018-11-09 ENCOUNTER — Encounter (HOSPITAL_BASED_OUTPATIENT_CLINIC_OR_DEPARTMENT_OTHER): Payer: Self-pay | Admitting: Obstetrics and Gynecology
